# Patient Record
Sex: Male | Born: 1935 | Race: White | Hispanic: No | Smoking: Former smoker
Health system: Southern US, Community
[De-identification: ages and names within clinical notes are randomized; demographics above are authoritative.]

## PROBLEM LIST (undated history)

## (undated) DIAGNOSIS — G3183 Dementia with Lewy bodies: Secondary | ICD-10-CM

## (undated) DIAGNOSIS — F39 Unspecified mood [affective] disorder: Secondary | ICD-10-CM

## (undated) DIAGNOSIS — G20 Parkinson's disease: Secondary | ICD-10-CM

## (undated) DIAGNOSIS — F0280 Dementia in other diseases classified elsewhere without behavioral disturbance: Secondary | ICD-10-CM

## (undated) DIAGNOSIS — N400 Enlarged prostate without lower urinary tract symptoms: Secondary | ICD-10-CM

## (undated) DIAGNOSIS — I10 Essential (primary) hypertension: Secondary | ICD-10-CM

## (undated) DIAGNOSIS — K219 Gastro-esophageal reflux disease without esophagitis: Secondary | ICD-10-CM

## (undated) DIAGNOSIS — G20C Parkinsonism, unspecified: Secondary | ICD-10-CM

## (undated) HISTORY — DX: Unspecified mood (affective) disorder: F39

## (undated) HISTORY — DX: Parkinsonism, unspecified: G20.C

## (undated) HISTORY — DX: Dementia in other diseases classified elsewhere, unspecified severity, without behavioral disturbance, psychotic disturbance, mood disturbance, and anxiety: F02.80

## (undated) HISTORY — PX: CHOLECYSTECTOMY: SHX55

## (undated) HISTORY — DX: Dementia with Lewy bodies: G31.83

## (undated) HISTORY — DX: Benign prostatic hyperplasia without lower urinary tract symptoms: N40.0

## (undated) HISTORY — DX: Essential (primary) hypertension: I10

## (undated) HISTORY — DX: Parkinson's disease: G20

## (undated) HISTORY — DX: Gastro-esophageal reflux disease without esophagitis: K21.9

---

## 1999-04-07 HISTORY — PX: HERNIA REPAIR: SHX51

## 2004-11-14 ENCOUNTER — Ambulatory Visit: Payer: Self-pay | Admitting: Ophthalmology

## 2004-11-19 ENCOUNTER — Ambulatory Visit: Payer: Self-pay | Admitting: Ophthalmology

## 2004-12-25 ENCOUNTER — Ambulatory Visit: Payer: Self-pay | Admitting: Ophthalmology

## 2004-12-31 ENCOUNTER — Ambulatory Visit: Payer: Self-pay | Admitting: Ophthalmology

## 2006-11-26 ENCOUNTER — Ambulatory Visit: Payer: Self-pay | Admitting: Urology

## 2006-12-09 ENCOUNTER — Ambulatory Visit: Payer: Self-pay | Admitting: Urology

## 2006-12-09 ENCOUNTER — Other Ambulatory Visit: Payer: Self-pay

## 2006-12-14 ENCOUNTER — Ambulatory Visit: Payer: Self-pay | Admitting: Urology

## 2011-04-07 HISTORY — PX: OTHER SURGICAL HISTORY: SHX169

## 2011-07-01 ENCOUNTER — Ambulatory Visit: Payer: Self-pay | Admitting: Urology

## 2011-08-04 ENCOUNTER — Ambulatory Visit: Payer: Self-pay | Admitting: Urology

## 2011-08-10 ENCOUNTER — Ambulatory Visit: Payer: Self-pay | Admitting: Urology

## 2013-05-28 ENCOUNTER — Emergency Department: Payer: Self-pay | Admitting: Emergency Medicine

## 2013-07-24 ENCOUNTER — Encounter: Payer: Self-pay | Admitting: Internal Medicine

## 2013-07-24 DIAGNOSIS — K219 Gastro-esophageal reflux disease without esophagitis: Secondary | ICD-10-CM | POA: Insufficient documentation

## 2013-07-24 DIAGNOSIS — F39 Unspecified mood [affective] disorder: Secondary | ICD-10-CM | POA: Insufficient documentation

## 2013-07-24 DIAGNOSIS — I10 Essential (primary) hypertension: Secondary | ICD-10-CM | POA: Insufficient documentation

## 2013-07-24 DIAGNOSIS — F0281 Dementia in other diseases classified elsewhere with behavioral disturbance: Secondary | ICD-10-CM | POA: Insufficient documentation

## 2013-07-24 DIAGNOSIS — N400 Enlarged prostate without lower urinary tract symptoms: Secondary | ICD-10-CM | POA: Insufficient documentation

## 2013-07-24 DIAGNOSIS — G3183 Dementia with Lewy bodies: Secondary | ICD-10-CM

## 2013-07-24 DIAGNOSIS — G20 Parkinson's disease: Secondary | ICD-10-CM | POA: Insufficient documentation

## 2013-08-03 ENCOUNTER — Non-Acute Institutional Stay: Payer: Medicare Other | Admitting: Internal Medicine

## 2013-08-03 ENCOUNTER — Encounter: Payer: Self-pay | Admitting: Internal Medicine

## 2013-08-03 VITALS — BP 120/70 | HR 76 | Temp 98.4°F | Resp 20 | Wt 211.0 lb

## 2013-08-03 DIAGNOSIS — N400 Enlarged prostate without lower urinary tract symptoms: Secondary | ICD-10-CM

## 2013-08-03 DIAGNOSIS — F0280 Dementia in other diseases classified elsewhere without behavioral disturbance: Secondary | ICD-10-CM

## 2013-08-03 DIAGNOSIS — I10 Essential (primary) hypertension: Secondary | ICD-10-CM

## 2013-08-03 DIAGNOSIS — G20 Parkinson's disease: Secondary | ICD-10-CM

## 2013-08-03 DIAGNOSIS — G3183 Dementia with Lewy bodies: Principal | ICD-10-CM

## 2013-08-03 DIAGNOSIS — F39 Unspecified mood [affective] disorder: Secondary | ICD-10-CM

## 2013-08-03 NOTE — Assessment & Plan Note (Signed)
BP Readings from Last 3 Encounters:  08/03/13 120/70   Will watch for now Might want to cut down the atenolol more--certainly if any more dizziness

## 2013-08-03 NOTE — Assessment & Plan Note (Signed)
Paroxetine when stress/depression with dying wife Now stable Will consider weaning off when stable in current setting

## 2013-08-03 NOTE — Progress Notes (Signed)
Subjective:    Patient ID: Victor [name], male    DOB: 11/09/1926, 80 y.o.   MRN: [id number]  HPI Reviewed status with Victor [name]  Daughter Victor [name] and niece Victor [name] are here  Widowed about 2 years ago Moved to independent living here about 1.5 years ago Now into assisted living for increased care needs Needs help with instrumental ADLs and daughter handles finances Got lost in building about a week ago--caused some distress  Recognizes some memory loss Followed by Victor [name] for a while---initially Parkinsonian symptoms (tremor) Then provisional diagnosis of Lewy body dementia Vivid dreams --like that someone knocked on his door---but no clear hallucinations  Known BPH Voids okay Recently changed from avodart to finasteride Still seems to be okay Has had bladder stones 2 separate times--- Victor [name] in past. Now seeing Victor [name]  Had been wife's caregiver During that time he had physical symptoms and weight loss Started on paroxetine at that time---and it seemed to help Doesn't seem depressed now  Known high blood pressure for many years Atenolol was initially started for palpitations Stable on this Rx now---dose was cut due to orthostatic dizziness not that long ago  Past acid reflux Off nexium for a couple of years No recent problems  Current Outpatient Prescriptions on File Prior to Visit  Medication Sig Dispense Refill  . atenolol (TENORMIN) 25 MG tablet Take by mouth daily.      . Multiple Vitamins-Minerals (PRESERVISION AREDS PO) Take 2 tablets by mouth daily.      Marland Kitchen. PARoxetine (PAXIL) 10 MG tablet Take 10 mg by mouth daily.       No current facility-administered medications on file prior to visit.    Allergies  Allergen Reactions  . Codeine     Past Medical History  Diagnosis Date  . Lewy body dementia without behavioral disturbance   . Parkinsonism   . BPH (benign prostatic hypertrophy)   . Episodic mood disorder     mild depression  .  Hypertension   . GERD (gastroesophageal reflux disease)     Past Surgical History  Procedure Laterality Date  . Hernia repair  2001  . Cholecystectomy  ?1980's  . Bladder stones removed  2013    Family History  Problem Relation Age of Onset  . Heart disease Father     History   Social History  . Marital Status: Widowed    Spouse Name: N/A    Number of Children: 1  . Years of Education: N/A   Occupational History  . 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 Smoking status: Former Smoker    Quit date: 04/06/1958  . Smokeless tobacco: Never Used  . Alcohol Use: Yes     Comment: very rare glass of wine  . Drug Use: Not on file  . Sexual Activity: Not on file   Other Topics Concern  . Not on file   Social History Narrative   Has living will   Daughter Arline AspCindy is health care POA   Would accept attempts at resuscitation   Wouldn't want feeding tube if cognitively unaware   Review of Systems  Constitutional: Negative for fatigue and unexpected weight change.  HENT: Positive for hearing loss.        Mostly own teeth-- generally okay. Hasn't been regular with the dentist  Eyes: Negative for visual disturbance.  Respiratory: Negative for cough, chest tightness and shortness of breath.  Cardiovascular: Negative for chest pain, palpitations and leg swelling.  Gastrointestinal: Negative for nausea, vomiting, abdominal pain and constipation.  Genitourinary: Positive for frequency. Negative for urgency and difficulty urinating.  Musculoskeletal: Positive for arthralgias.       Some pain in shoulders Walks with rollator  Skin:       Sees Victor [name]-- has had multiple lesions frozen and removed  Allergic/Immunologic: Negative for environmental allergies and immunocompromised state.  Neurological: Positive for tremors. Negative for dizziness, syncope, weakness, light-headedness and headaches.  Psychiatric/Behavioral: Negative for sleep  disturbance and dysphoric mood. The patient is not nervous/anxious.        Objective:   Physical Exam  Constitutional: He appears well-developed and well-nourished. No distress.  Neck: Normal range of motion. Neck supple. No thyromegaly present.  Cardiovascular: Normal rate, regular rhythm, normal heart sounds and intact distal pulses.  Exam reveals no gallop.   No murmur heard. Pulmonary/Chest: Effort normal and breath sounds normal. No respiratory distress. He has no wheezes. He has no rales.  Abdominal: Soft. There is no tenderness.  Musculoskeletal: He exhibits no edema and no tenderness.  Lymphadenopathy:    He has no cervical adenopathy.  Neurological:  A;ert, keeps some distant memory Knew April 2015 Took a while--then remembered [place] President --- "Black man", last remembered name--- "Johnson"  Slow gait but not shuffling No major bradykinesia Romberg negative Finger to nose pretty normal Very slight resting hand tremor No major stiffness  Skin: No rash noted.          Assessment & Plan:

## 2013-08-03 NOTE — Assessment & Plan Note (Signed)
Voiding okay Recent change to finasteride--for price

## 2013-08-03 NOTE — Assessment & Plan Note (Signed)
Putative diagnosis No sig psychotic features Clearly some dementia though Currently, seems okay here. Will probably need Memory Care over time

## 2013-08-03 NOTE — Assessment & Plan Note (Signed)
Mild  No Rx now---sedation with sinemet Walking better since therapy

## 2013-08-04 ENCOUNTER — Telehealth: Payer: Self-pay | Admitting: Internal Medicine

## 2013-08-04 NOTE — Telephone Encounter (Signed)
emmi mailed to patient °

## 2013-10-19 ENCOUNTER — Encounter: Payer: Self-pay | Admitting: Internal Medicine

## 2013-10-19 ENCOUNTER — Non-Acute Institutional Stay: Payer: Medicare Other | Admitting: Internal Medicine

## 2013-10-19 VITALS — BP 112/60 | HR 72 | Temp 98.1°F | Resp 18 | Wt 218.0 lb

## 2013-10-19 DIAGNOSIS — N400 Enlarged prostate without lower urinary tract symptoms: Secondary | ICD-10-CM

## 2013-10-19 DIAGNOSIS — K219 Gastro-esophageal reflux disease without esophagitis: Secondary | ICD-10-CM

## 2013-10-19 DIAGNOSIS — F39 Unspecified mood [affective] disorder: Secondary | ICD-10-CM

## 2013-10-19 DIAGNOSIS — G3183 Dementia with Lewy bodies: Principal | ICD-10-CM

## 2013-10-19 DIAGNOSIS — F0280 Dementia in other diseases classified elsewhere without behavioral disturbance: Secondary | ICD-10-CM

## 2013-10-19 DIAGNOSIS — I10 Essential (primary) hypertension: Secondary | ICD-10-CM

## 2013-10-19 NOTE — Assessment & Plan Note (Signed)
Voids fine on the finasteride 

## 2013-10-19 NOTE — Assessment & Plan Note (Signed)
Still confused but seems to have stabilized in the structured environment here Not as Parkinsonian now No Rx

## 2013-10-19 NOTE — Assessment & Plan Note (Signed)
No apparent symptoms 

## 2013-10-19 NOTE — Assessment & Plan Note (Signed)
Doing well on low dose paroxetine Will continue (may be a significant reason for his improvement)

## 2013-10-19 NOTE — Assessment & Plan Note (Signed)
BP Readings from Last 3 Encounters:  10/19/13 112/60  08/03/13 120/70   Good control on low dose atenolol If it stays down, I will consider stopping at his next visit

## 2013-10-19 NOTE — Progress Notes (Signed)
Subjective:    Patient ID: Victor [name], male    DOB: 10/20/1926, 80 y.o.   MRN: [id number]  HPI Reviewed status with Florentina AddisonKatie RN Doing fairly well Still gets confused regularly May lose his bearings in the building---so he has wanderguard on  No deliberate elopement attempts  He seems satisfied He notes that he is still deciding about staying here or going back to his old place--but admits that he knows his daughter has told him he will be staying here  [facility] No falls  Voices satisfaction here No ongoing anxiety or depressed mood now  Voids fine Nocturia x 1-2 is stable No daytime problems Denies sig incontinence  No chest pain No SOB No dizziness or syncope  Current Outpatient Prescriptions on File Prior to Visit  Medication Sig Dispense Refill  . atenolol (TENORMIN) 25 MG tablet Take 12.5 mg by mouth daily.       . finasteride (PROSCAR) 5 MG tablet Take 5 mg by mouth daily.      . Multiple Vitamins-Minerals (PRESERVISION AREDS PO) Take 2 tablets by mouth daily.      Marland Kitchen. PARoxetine (PAXIL) 10 MG tablet Take 10 mg by mouth daily.       No current facility-administered medications on file prior to visit.    Allergies  Allergen Reactions  . Codeine     Past Medical History  Diagnosis Date  . Lewy body dementia without behavioral disturbance   . Parkinsonism   . BPH (benign prostatic hypertrophy)   . Episodic mood disorder     mild depression  . Hypertension   . GERD (gastroesophageal reflux disease)     Past Surgical History  Procedure Laterality Date  . Hernia repair  2001  . Cholecystectomy  ?1980's  . Bladder stones removed  2013    Family History  Problem Relation Age of Onset  . Heart disease Father     History   Social History  . Marital Status: Widowed    Spouse Name: N/A    Number of Children: 1  . Years of Education: N/A   Occupational History  . 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 Smoking status: Former Smoker    Quit date: 04/06/1958  . Smokeless tobacco: Never Used  . Alcohol Use: Yes     Comment: very rare glass of wine  . Drug Use: Not on file  . Sexual Activity: Not on file   Other Topics Concern  . Not on file   Social History Narrative   Has living will   Daughter Arline AspCindy is health care POA   Would accept attempts at resuscitation   Wouldn't want feeding tube if cognitively unaware   Review of Systems Sleeps well Appetite is good Weight is up 7#--- will watch Bowels are okay     Objective:   Physical Exam  Constitutional: He appears well-developed and well-nourished. No distress.  Neck: Normal range of motion. Neck supple. No thyromegaly present.  Cardiovascular: Normal rate, regular rhythm and normal heart sounds.  Exam reveals no gallop.   No murmur heard. Pulmonary/Chest: Effort normal and breath sounds normal. No respiratory distress. He has no wheezes. He has no rales.  Abdominal: Soft. There is no tenderness.  Musculoskeletal: He exhibits no edema and no tenderness.  Lymphadenopathy:    He has no cervical adenopathy.  Neurological:  Good pace walking No sig bradykinesia  Psychiatric: He  has a normal mood and affect. His behavior is normal.          Assessment & Plan:

## 2013-12-06 ENCOUNTER — Encounter: Payer: Self-pay | Admitting: Internal Medicine

## 2013-12-06 ENCOUNTER — Non-Acute Institutional Stay: Payer: Medicare Other | Admitting: Internal Medicine

## 2013-12-06 VITALS — BP 120/82 | HR 79 | Temp 97.5°F

## 2013-12-06 DIAGNOSIS — IMO0002 Reserved for concepts with insufficient information to code with codable children: Secondary | ICD-10-CM

## 2013-12-06 DIAGNOSIS — F411 Generalized anxiety disorder: Secondary | ICD-10-CM

## 2013-12-06 DIAGNOSIS — G3183 Dementia with Lewy bodies: Principal | ICD-10-CM

## 2013-12-06 DIAGNOSIS — F0280 Dementia in other diseases classified elsewhere without behavioral disturbance: Secondary | ICD-10-CM

## 2013-12-06 DIAGNOSIS — R451 Restlessness and agitation: Secondary | ICD-10-CM

## 2013-12-06 MED ORDER — PAROXETINE HCL 20 MG PO TABS: 20.0000 mg | ORAL_TABLET | Freq: Every day | ORAL | Status: DC

## 2013-12-06 MED ORDER — ALPRAZOLAM 0.5 MG PO TABS: ORAL_TABLET | ORAL | Status: DC

## 2013-12-06 NOTE — Progress Notes (Signed)
Subjective:    Patient ID: Victor [name], male    DOB: February 21, 1927, 80 y.o.   MRN: [id number]  HPI  Asked to evaluate Victor [name] by RN at [facility]. RN reports that Victor [name] had 2 episodes over the weekend where Victor [name] became acutely confused, agitated and had visual hallucinations. Resident does not remember these episodes. Both episodes occurred during dinner. The first episode, Victor [name] became angry and agitated after Victor [name] got up from the table and another resident advised him that Victor [name] should not be walking without his walker. The other incident occurred when Victor [name] thought someone in the facility had a gun. Victor [name] became very paranoid, anxious and locked himself in his room. According to the RN, they have noticed these behaviors intermittently. Victor [name] does have a history of Lewy Body Dementia. Victor [name] has also had a history of mild mood disorder. Victor [name] denies depression, SI/HI. Victor [name] has had some anxiety. His symptoms always seem to be worse in the evening. Victor [name] is on low dose paxil.  Review of Systems      Past Medical History  Diagnosis Date  . Lewy body dementia without behavioral disturbance   . Parkinsonism   . BPH (benign prostatic hypertrophy)   . Episodic mood disorder     mild depression  . Hypertension   . GERD (gastroesophageal reflux disease)     Current Outpatient Prescriptions  Medication Sig Dispense Refill  . atenolol (TENORMIN) 25 MG tablet Take 12.5 mg by mouth daily.       . finasteride (PROSCAR) 5 MG tablet Take 5 mg by mouth daily.      . Multiple Vitamins-Minerals (PRESERVISION AREDS PO) Take 2 tablets by mouth daily.      Marland Kitchen PARoxetine (PAXIL) 10 MG tablet Take 10 mg by mouth daily.       No current facility-administered medications for this visit.    Allergies  Allergen Reactions  . Codeine     Family History  Problem Relation Age of Onset  . Heart disease Father     History   Social History  . Marital Status: Widowed    Spouse Name: N/A    Number of Children: 1  . Years of Education:  N/A   Occupational History  . Minister-- Chief Strategy Officer     Retired   Social History Main Topics  . Smoking status: Former Smoker    Quit date: 04/06/1958  . Smokeless tobacco: Never Used  . Alcohol Use: Yes     Comment: very rare glass of wine  . Drug Use: Not on file  . Sexual Activity: Not on file   Other Topics Concern  . Not on file   Social History Narrative   Has living will   Daughter Arline Asp is health care POA   Would accept attempts at resuscitation   Wouldn't want feeding tube if cognitively unaware     Constitutional: Denies fever, malaise, fatigue, headache or abrupt weight changes.  HEENT: Denies eye pain, eye redness, ear pain, ringing in the ears, wax buildup, runny nose, nasal congestion, bloody nose, or sore throat. Respiratory: Denies difficulty breathing, shortness of breath, cough or sputum production.   Cardiovascular: Denies chest pain, chest tightness, palpitations or swelling in the hands or feet.  Gastrointestinal: Denies abdominal pain, bloating, constipation, diarrhea or blood in the stool.  GU: Denies urgency, frequency, pain with urination, burning sensation, blood in urine, odor or discharge. Neurological: Family reports confusion. Denies dizziness, difficulty with memory, difficulty with speech or  problems with balance and coordination.  Psych: Family reports agitation and hallucinations. Pt reports anxiety. Denies depression, SI/HI.  No other specific complaints in a complete review of systems (except as listed in HPI above).  Objective:   Physical Exam   BP 120/82  Pulse 79  Temp(Src) 97.5 F (36.4 C)  SpO2 99% Wt Readings from Last 3 Encounters:  10/19/13 218 lb (98.884 kg)  08/03/13 211 lb (95.709 kg)    General: Appears his stated age, well developed, well nourished in NAD. Cardiovascular: Normal rate and rhythm. S1,S2 noted.  No murmur, rubs or gallops noted.  Pulmonary/Chest: Normal effort and positive vesicular breath  sounds. No respiratory distress. No wheezes, rales or ronchi noted.   Neurological: Alert. Does seemed confused today regarding the events over the weekend but otherwise oriented. Psychiatric: Mood pleasant and affect normal. Behavior is normal. Judgment normal. Thought delayed.       Assessment & Plan:   Lewy Body Dementia with anxiety and agitation:  Likely a progression of his dementia Will increase his paxil to 20 mg QHS Will give RX for xanax 0.25 mg-0.5 mg q12H prn for agitation Discussed with family about not starting a antipsychotic at this time- they understand and agree UA and culture by RN was negative Will check CBC and CMET  Nurse will continue to monitor for new or worsening symptoms. Will notify MD.

## 2013-12-06 NOTE — Patient Instructions (Signed)

## 2013-12-07 ENCOUNTER — Ambulatory Visit: Payer: Medicare Other | Admitting: Internal Medicine

## 2013-12-12 ENCOUNTER — Encounter: Payer: Self-pay | Admitting: Internal Medicine

## 2013-12-14 ENCOUNTER — Encounter: Payer: Self-pay | Admitting: Internal Medicine

## 2014-01-25 ENCOUNTER — Encounter: Payer: Self-pay | Admitting: Internal Medicine

## 2014-01-25 ENCOUNTER — Non-Acute Institutional Stay: Payer: Medicare Other | Admitting: Internal Medicine

## 2014-01-25 VITALS — BP 120/58 | HR 62 | Temp 96.6°F | Resp 12 | Wt 227.2 lb

## 2014-01-25 DIAGNOSIS — G3183 Dementia with Lewy bodies: Secondary | ICD-10-CM

## 2014-01-25 DIAGNOSIS — N400 Enlarged prostate without lower urinary tract symptoms: Secondary | ICD-10-CM

## 2014-01-25 DIAGNOSIS — F0281 Dementia in other diseases classified elsewhere with behavioral disturbance: Secondary | ICD-10-CM

## 2014-01-25 DIAGNOSIS — F02818 Dementia in other diseases classified elsewhere, unspecified severity, with other behavioral disturbance: Secondary | ICD-10-CM

## 2014-01-25 DIAGNOSIS — F39 Unspecified mood [affective] disorder: Secondary | ICD-10-CM

## 2014-01-25 DIAGNOSIS — K219 Gastro-esophageal reflux disease without esophagitis: Secondary | ICD-10-CM

## 2014-01-25 DIAGNOSIS — I10 Essential (primary) hypertension: Secondary | ICD-10-CM

## 2014-01-25 NOTE — Assessment & Plan Note (Signed)
BP Readings from Last 3 Encounters:  01/25/14 120/58  12/06/13 120/82  10/19/13 112/60   Good control Has been running low so will try off the atenolol

## 2014-01-25 NOTE — Assessment & Plan Note (Signed)
No apparent problems Eating well without dysphagia

## 2014-01-25 NOTE — Assessment & Plan Note (Signed)
More anxiety now with trouble keeping reality intact with his worsened confusion Will continue current meds--but may be able to wean if stable in unit with more support

## 2014-01-25 NOTE — Assessment & Plan Note (Signed)
Voiding acceptably. Some incontinence likely more related to the dementia

## 2014-01-25 NOTE — Progress Notes (Signed)
Subjective:    Patient ID: Victor [name], male    DOB: 08/19/1926, 80 y.o.   MRN: [id number]  HPI Daughter is here Reviewed status with Victor ChessmanMandy RN Reviewed behavior monitoring  Has continued to be confused, get lost and some agitation This goes back for at least 2 months Fortunately, when he is lost--hasn't tried to elope  Has gotten [place] [facility] [place] Was getting upset and no longer got any benefit  No recent hallucinations noted Walking okay Did fall when at beach with his daughter Victor IdeWalks with rollator okay in [name]  Still seeing the urologist every 6 months Just had PSA!!! Discussed that this is inappropriate Some incontinence at times---wears protection  Current Outpatient Prescriptions on File Prior to Visit  Medication Sig Dispense Refill  . ALPRAZolam (XANAX) 0.5 MG tablet Give 1/2 -1 tab q12h prn for agitation  30 tablet  0  . atenolol (TENORMIN) 25 MG tablet Take 12.5 mg by mouth daily.       . finasteride (PROSCAR) 5 MG tablet Take 5 mg by mouth daily.      . Multiple Vitamins-Minerals (PRESERVISION AREDS PO) Take 2 tablets by mouth daily.      Marland Kitchen. PARoxetine (PAXIL) 20 MG tablet Take 1 tablet (20 mg total) by mouth daily.  30 tablet  5   No current facility-administered medications on file prior to visit.    Allergies  Allergen Reactions  . Codeine     Past Medical History  Diagnosis Date  . Lewy body dementia without behavioral disturbance   . Parkinsonism   . BPH (benign prostatic hypertrophy)   . Episodic mood disorder     mild depression  . Hypertension   . GERD (gastroesophageal reflux disease)     Past Surgical History  Procedure Laterality Date  . Hernia repair  2001  . Cholecystectomy  ?1980's  . Bladder stones removed  2013    Family History  Problem Relation Age of Onset  . Heart disease Father     History   Social History  . Marital Status: Widowed    Spouse Name: N/A    Number of Children: 1  . Years  of Education: N/A   Occupational History  . 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 Smoking status: Former Smoker    Quit date: 04/06/1958  . Smokeless tobacco: Never Used  . Alcohol Use: Yes     Comment: very rare glass of wine  . Drug Use: Not on file  . Sexual Activity: Not on file   Other Topics Concern  . Not on file   Social History Narrative   Has living will   Daughter Victor [name] is health care POA   Would accept attempts at resuscitation   Wouldn't want feeding tube if cognitively unaware   Review of Systems Appetite is good Weight has been going up since here---he is now back to his normal weight Sleeps okay Voids okay--needs direction at times    Objective:   Physical Exam  Constitutional: He appears well-developed and well-nourished. No distress.  Neck: Normal range of motion. Neck supple. No thyromegaly present.  Cardiovascular: Normal rate, regular rhythm and normal heart sounds.  Exam reveals no gallop.   No murmur heard. Pulmonary/Chest: Effort normal and breath sounds normal. No respiratory distress. He has no wheezes. He has no rales.  Abdominal: Soft. There is no tenderness.  Musculoskeletal: He exhibits  no edema and no tenderness.  Lymphadenopathy:    He has no cervical adenopathy.  Neurological:  Doesn't remember much of the issues that have happened. Sort of knows he gets upset at times Fairly normal engagement with this visit  Psychiatric: He has a normal mood and affect. His behavior is normal.          Assessment & Plan:

## 2014-01-25 NOTE — Assessment & Plan Note (Signed)
Not as much report of hallucinations, etc---but has been more confused and needed support of staff and other residents to hold it together. Confused when he goes down in elevator--just too much for him Will look into transfer to Memory Care

## 2014-02-12 DIAGNOSIS — N400 Enlarged prostate without lower urinary tract symptoms: Secondary | ICD-10-CM

## 2014-02-12 DIAGNOSIS — K219 Gastro-esophageal reflux disease without esophagitis: Secondary | ICD-10-CM

## 2014-02-12 DIAGNOSIS — I10 Essential (primary) hypertension: Secondary | ICD-10-CM

## 2014-02-12 DIAGNOSIS — F39 Unspecified mood [affective] disorder: Secondary | ICD-10-CM

## 2014-02-12 DIAGNOSIS — G3183 Dementia with Lewy bodies: Secondary | ICD-10-CM

## 2014-03-08 DIAGNOSIS — F4323 Adjustment disorder with mixed anxiety and depressed mood: Secondary | ICD-10-CM

## 2014-04-13 DIAGNOSIS — L219 Seborrheic dermatitis, unspecified: Secondary | ICD-10-CM

## 2014-07-20 DIAGNOSIS — G3183 Dementia with Lewy bodies: Secondary | ICD-10-CM

## 2014-07-20 DIAGNOSIS — I10 Essential (primary) hypertension: Secondary | ICD-10-CM | POA: Diagnosis not present

## 2014-07-20 DIAGNOSIS — N401 Enlarged prostate with lower urinary tract symptoms: Secondary | ICD-10-CM | POA: Diagnosis not present

## 2014-07-20 DIAGNOSIS — K219 Gastro-esophageal reflux disease without esophagitis: Secondary | ICD-10-CM | POA: Diagnosis not present

## 2014-07-20 DIAGNOSIS — F4323 Adjustment disorder with mixed anxiety and depressed mood: Secondary | ICD-10-CM | POA: Diagnosis not present

## 2014-07-29 NOTE — Op Note (Signed)
PATIENT NAME:  Victor [name], Victor [name] MR#:  [id number] DATE OF BIRTH:  Sep 25, 1926  DATE OF PROCEDURE:  08/10/2011  PREOPERATIVE DIAGNOSIS: Bladder stones.   POSTOPERATIVE DIAGNOSIS: Bladder stones.   PROCEDURE: Cystolitholapaxy, Holmium laser assisted.   SURGEON: Rica KoyanagiJohn S. Treniya Lobb, MD  ANESTHESIA: General.   INDICATIONS: This patient has a history of bladder stones which have formed repeatedly over a period of years. A cystoscopy done a year ago revealed two stones of significant size in the bladder. He has declined having them removed until the present time when these stones have a tendency to obstruct the outflow of urine periodically. He is scheduled for cystolitholapaxy laser assisted.   DESCRIPTION OF PROCEDURE: In the dorsal lithotomy position under general anesthesia, the lower abdomen and genital area was prepped and draped for endoscopic work. The #21 cystoscope was introduced and inspection revealed three large stones and innumerable small stones. The patient has multiple diverticula in the posterior bladder wall. These stones are contained within.    A search was made for the larger stones which were treated with the Holmium laser or morcellated. The stones located in the diverticula were either irrigated out and the fragments of the larger stones grasped with grasping forceps and removed under direct vision. Thorough search was made until no more stones could be identified. A #18 French Foley catheter was placed in the bladder and left indwelling. The patient tolerated the procedure well and returned to the recovery area in satisfactory condition. He received a B and O suppository at the conclusion of the procedure. ____________________________ Rica KoyanagiJohn S. Alpa Salvo, MD jsh:cms [name]: 08/10/2011 14:01:09 ET T: 08/10/2011 15:24:26 ET  JOB#: [id number] cc: Rica KoyanagiJohn S. Julee Stoll, MD, <Dictator> Rica KoyanagiJOHN S Rosamond Andress MD ELECTRONICALLY SIGNED 08/19/2011 14:17

## 2014-08-27 ENCOUNTER — Ambulatory Visit (INDEPENDENT_AMBULATORY_CARE_PROVIDER_SITE_OTHER): Payer: Medicare Other | Admitting: Internal Medicine

## 2014-08-27 ENCOUNTER — Encounter: Payer: Self-pay | Admitting: Internal Medicine

## 2014-08-27 VITALS — BP 130/80 | HR 77 | Temp 98.1°F | Wt 230.0 lb

## 2014-08-27 DIAGNOSIS — L858 Other specified epidermal thickening: Secondary | ICD-10-CM | POA: Diagnosis not present

## 2014-08-27 NOTE — Assessment & Plan Note (Signed)
Fairly large-- raised 8-649mm and 13-4914mm diameter Some pain Cryotherapy 1 minute x 2 Tolerated well

## 2014-08-27 NOTE — Progress Notes (Signed)
   Subjective:    Patient ID: Victor [name], male    DOB: 01/28/1927, 81 y.o.   MRN: [id number]  HPI [facility] Has thick cutaneous horn that is painful  Here for cryotherapy Here with daughter  Review of Systems     Objective:   Physical Exam        Assessment & Plan:

## 2014-08-27 NOTE — Progress Notes (Signed)
Pre visit review using our clinic review tool, if applicable. No additional management support is needed unless otherwise documented below in the visit note. 

## 2014-09-07 DIAGNOSIS — F4323 Adjustment disorder with mixed anxiety and depressed mood: Secondary | ICD-10-CM

## 2014-09-07 DIAGNOSIS — N400 Enlarged prostate without lower urinary tract symptoms: Secondary | ICD-10-CM

## 2014-09-07 DIAGNOSIS — K219 Gastro-esophageal reflux disease without esophagitis: Secondary | ICD-10-CM

## 2014-09-07 DIAGNOSIS — G3183 Dementia with Lewy bodies: Secondary | ICD-10-CM | POA: Diagnosis not present

## 2014-09-07 DIAGNOSIS — I10 Essential (primary) hypertension: Secondary | ICD-10-CM | POA: Diagnosis not present

## 2014-10-03 DIAGNOSIS — L989 Disorder of the skin and subcutaneous tissue, unspecified: Secondary | ICD-10-CM

## 2014-11-14 DIAGNOSIS — I10 Essential (primary) hypertension: Secondary | ICD-10-CM

## 2014-11-14 DIAGNOSIS — K219 Gastro-esophageal reflux disease without esophagitis: Secondary | ICD-10-CM

## 2014-11-14 DIAGNOSIS — F4323 Adjustment disorder with mixed anxiety and depressed mood: Secondary | ICD-10-CM

## 2014-11-14 DIAGNOSIS — N400 Enlarged prostate without lower urinary tract symptoms: Secondary | ICD-10-CM

## 2014-11-14 DIAGNOSIS — G3183 Dementia with Lewy bodies: Secondary | ICD-10-CM

## 2015-01-02 DIAGNOSIS — L989 Disorder of the skin and subcutaneous tissue, unspecified: Secondary | ICD-10-CM | POA: Diagnosis not present

## 2015-01-23 DIAGNOSIS — I10 Essential (primary) hypertension: Secondary | ICD-10-CM

## 2015-01-23 DIAGNOSIS — N401 Enlarged prostate with lower urinary tract symptoms: Secondary | ICD-10-CM

## 2015-01-23 DIAGNOSIS — K219 Gastro-esophageal reflux disease without esophagitis: Secondary | ICD-10-CM

## 2015-01-23 DIAGNOSIS — G309 Alzheimer's disease, unspecified: Secondary | ICD-10-CM

## 2015-01-23 DIAGNOSIS — G3183 Dementia with Lewy bodies: Secondary | ICD-10-CM | POA: Diagnosis not present

## 2015-02-27 DIAGNOSIS — R4182 Altered mental status, unspecified: Secondary | ICD-10-CM | POA: Diagnosis not present

## 2015-03-04 ENCOUNTER — Telehealth: Payer: Self-pay | Admitting: *Deleted

## 2015-03-04 NOTE — Telephone Encounter (Signed)
I was aware of this and it was expected

## 2015-03-04 NOTE — Telephone Encounter (Signed)
[facility] Night - Client TELEPHONE ADVICE RECORD [facility] Patient Name: Victor [name] Gender: Male DOB: 12/23/1926 Age: [age] Y 8 M 25 D Return Phone Number: [phone] (Primary) Address: [place]State[place]Zip: [place] [address] Client [facility] - [facility] Client Site  [facility] - Night Physician Tillman AbideLetvak, Richard Contact Type Call Call Type Page Only Caller Name Darl PikesSusan w/ [facility] Relationship To Patient Provider Is this call to report lab results? No Return Phone Number Please choose phone number Initial Comment Caller states, Roselyn BeringSusan w/ [facility] [phone] resident passed away yesterday afternoon.Notification Only. --- She does not require a call back, she has a standing order to release the body. Nurse Assessment Guidelines Guideline Title Affirmed Question Affirmed Notes Nurse Date/Time (Eastern Time) Disp. Time Lamount Cohen(Eastern Time) Disposition Final User 10-16-14 2:46:24 PM [facility] Paging Queue Delana MeyerHodges, Ronald 10-16-14 3:01:01 PM Paged On Call back to Call Center - PC Lesia SagoBirdwell, Mary Ann 10-16-14 3:01:41 PM Paged On Call back to Call Center - PC Lesia SagoBirdwell, Mary Ann 10-16-14 3:04:43 PM Page Completed Yes Rowe ClackWebb, Eada

## 2015-03-07 DEATH — deceased

## 2015-04-05 IMAGING — CT CT CERVICAL SPINE WITHOUT CONTRAST
3 of 7 series · 10 of 33 positions shown, 12 images · non-contrast
Comparison: None.

CLINICAL DATA: Fall, left frontal hematoma, trauma

EXAM:
CT HEAD WITHOUT CONTRAST
CT CERVICAL SPINE WITHOUT CONTRAST
TECHNIQUE: Multidetector CT imaging of the head and cervical spine was
performed following the standard protocol without intravenous
contrast. Multiplanar CT image reconstructions of the cervical spine
were also generated.

[Series 8: sagittal bone · sagittal · 0.28mm/px · 5 of 50 slices shown, 6 images]
[im 17/50  bone]
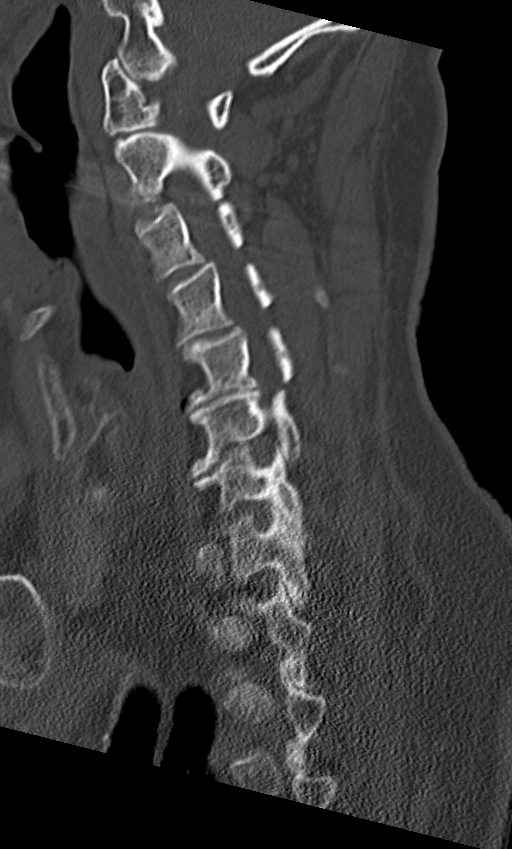
[im 21/50  bone]
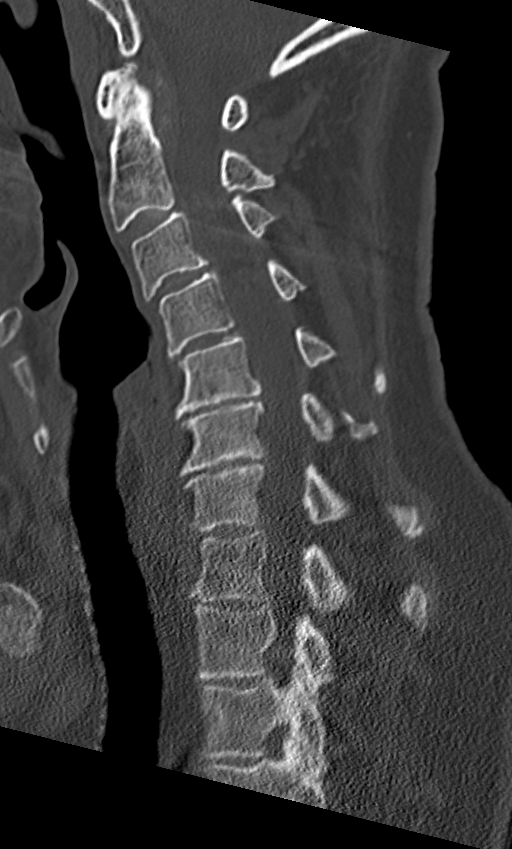
[im 25/50  soft-tissue]
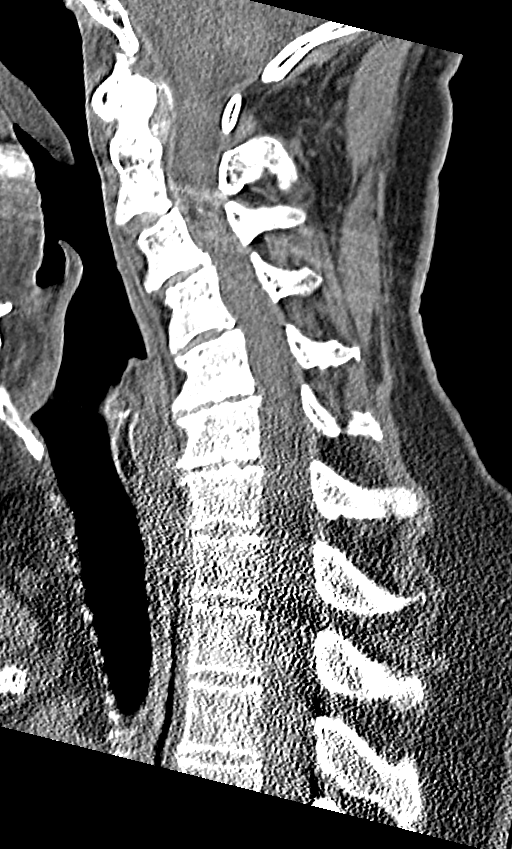
[im 25/50  bone]
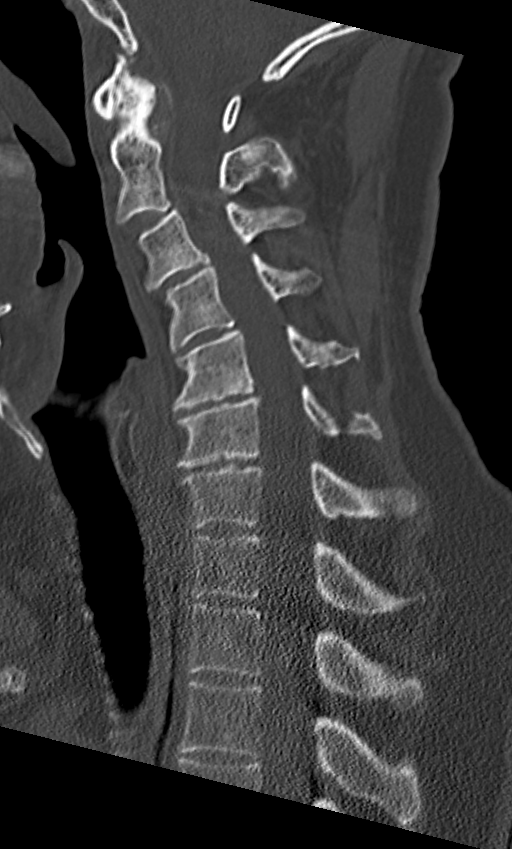
[im 29/50  bone]
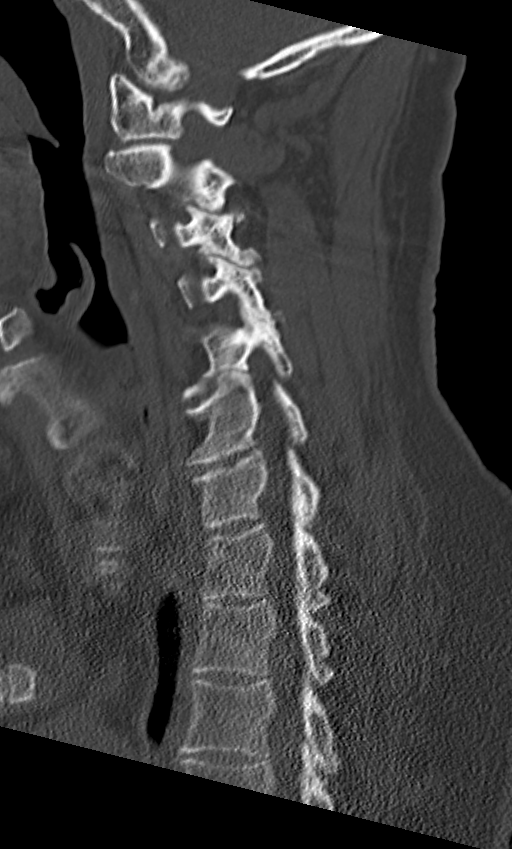
[im 33/50  bone]
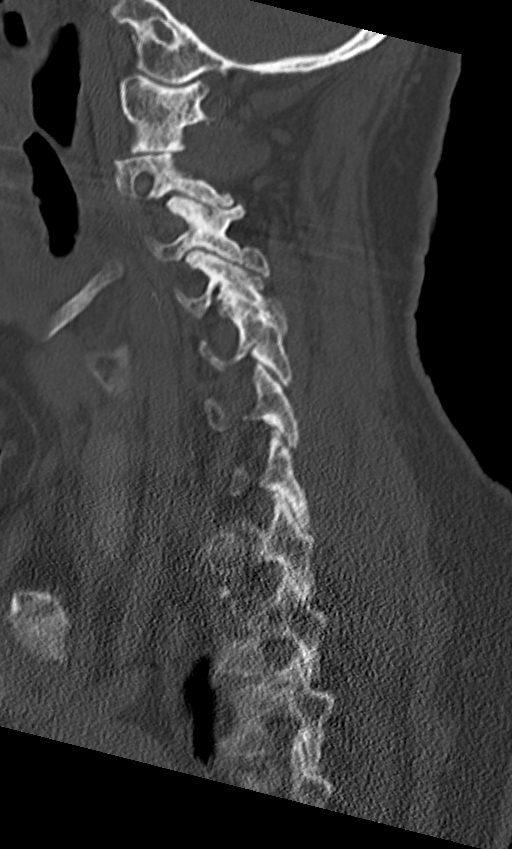

[Series 9: coronal bone · coronal · 0.22mm/px · 3 of 61 slices shown]
[im 13/61  bone]
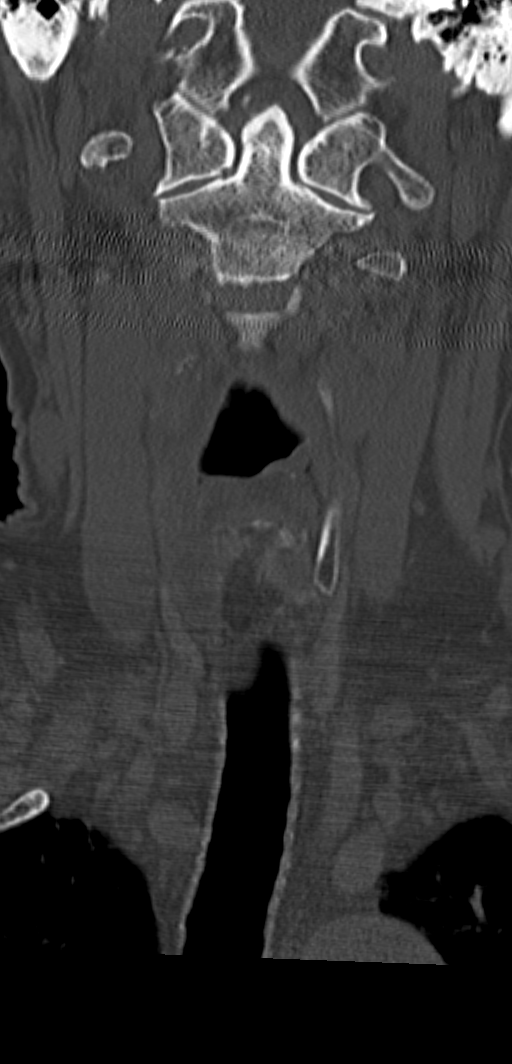
[im 25/61  bone]
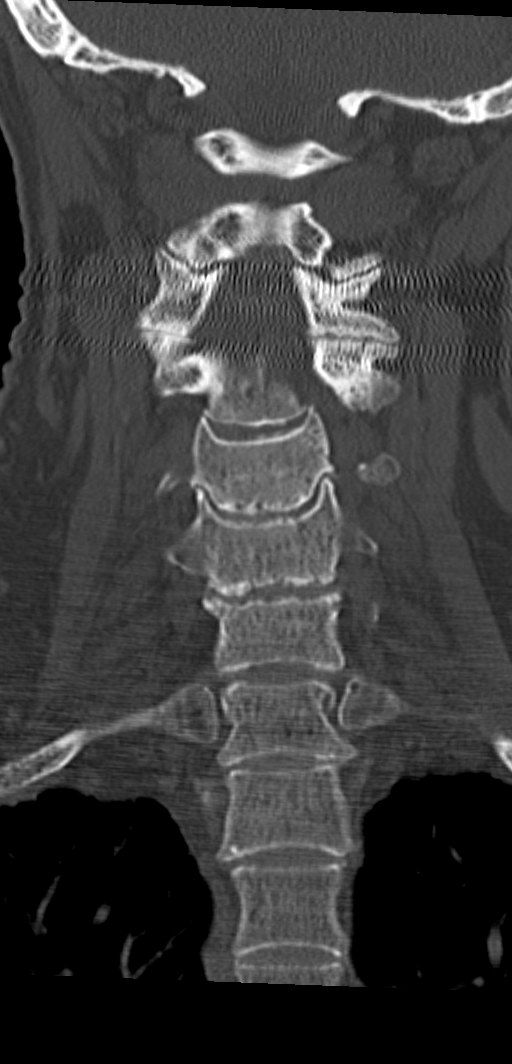
[im 37/61  bone]
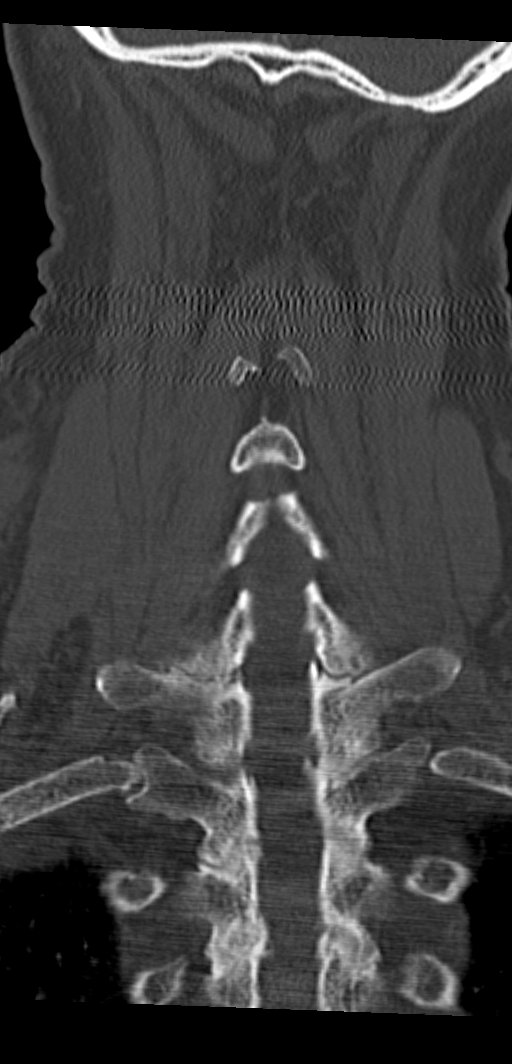

[Series 10: axial · axial · 0.31mm/px · z∈[-194,-125]mm · 2 of 108 slices shown, 3 images]
[im 36/108  soft-tissue]
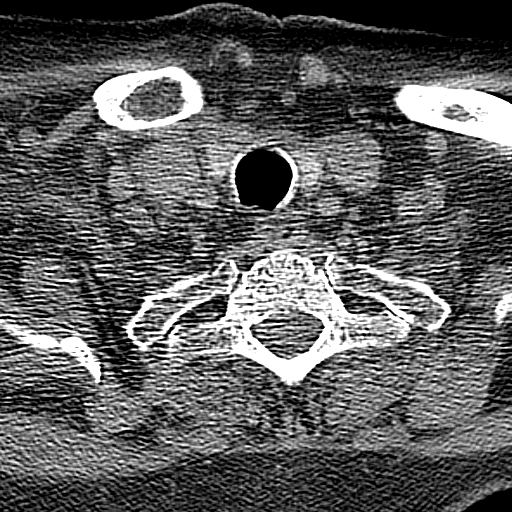
[im 36/108  bone]
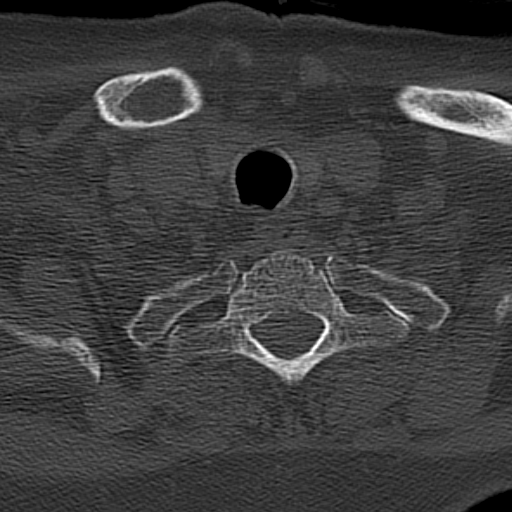
[im 72/108  bone]
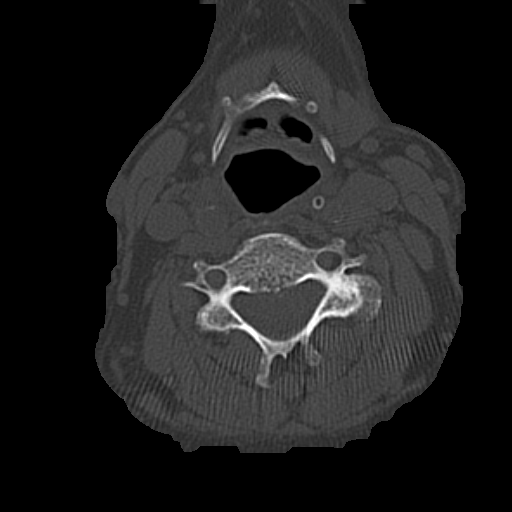

[10 of 33 positions shown; findings below may reference images not displayed]

FINDINGS: CT HEAD FINDINGS

Small left frontal scalp hyperdense hematoma noted. No Underlying
skull fracture. Diffuse brain atrophy with minor chronic white
matter ischemic change about the lateral ventricles. No acute
intracranial hemorrhage, mass lesion, definite infarction, midline
shift, herniation, hydrocephalus, or extra-axial fluid collection.
Normal gray-white matter differentiation. Cisterns patent.
Cerebellar atrophy as well. Atherosclerosis of the intracranial
vessels. Mastoids and sinuses remain clear.

CT CERVICAL SPINE FINDINGS

Slight kyphotic curvature of the cervical spine may be positional.
Diffuse degenerative disc disease and spondylosis, most pronounced
at C5-6 and C6-7. Multilevel facet arthropathy noted. No subluxation
or dislocation. Preserved vertebral body heights. Negative for
fracture or compression deformity. Intact odontoid. Normal
prevertebral soft tissues. No soft tissue asymmetry in the neck.
Lung apices clear. Carotid calcifications noted.
IMPRESSION: Left frontal scalp hematoma.

No acute intracranial finding

Cervical spine degenerative changes. No acute fracture or
malalignment.
